# Patient Record
Sex: Female | Born: 1995 | Hispanic: Yes | Marital: Single | Smoking: Never smoker
Health system: Southern US, Community
[De-identification: ages and names within clinical notes are randomized; demographics above are authoritative.]

## PROBLEM LIST (undated history)

## (undated) HISTORY — PX: WISDOM TOOTH EXTRACTION: SHX21

---
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
## 2016-01-17 ENCOUNTER — Emergency Department (HOSPITAL_COMMUNITY): Payer: BLUE CROSS/BLUE SHIELD

## 2016-01-17 ENCOUNTER — Encounter (HOSPITAL_COMMUNITY): Payer: Self-pay

## 2016-01-17 ENCOUNTER — Emergency Department (HOSPITAL_COMMUNITY)
Admission: EM | Admit: 2016-01-17 | Discharge: 2016-01-17 | Disposition: A | Discharge status: Home / Self Care | Payer: BLUE CROSS/BLUE SHIELD | Attending: Emergency Medicine | Admitting: Emergency Medicine

## 2016-01-17 DIAGNOSIS — Y998 Other external cause status: Secondary | ICD-10-CM | POA: Diagnosis not present

## 2016-01-17 DIAGNOSIS — Y92410 Unspecified street and highway as the place of occurrence of the external cause: Secondary | ICD-10-CM | POA: Diagnosis not present

## 2016-01-17 DIAGNOSIS — S060X0A Concussion without loss of consciousness, initial encounter: Secondary | ICD-10-CM | POA: Insufficient documentation

## 2016-01-17 DIAGNOSIS — Y9389 Activity, other specified: Secondary | ICD-10-CM | POA: Insufficient documentation

## 2016-01-17 DIAGNOSIS — S0990XA Unspecified injury of head, initial encounter: Secondary | ICD-10-CM | POA: Diagnosis present

## 2016-01-17 MED ORDER — IBUPROFEN 600 MG PO TABS: 600.0000 mg | ORAL_TABLET | Freq: Four times a day (QID) | ORAL | Status: AC | PRN
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
## 2016-01-17 NOTE — Discharge Instructions (Signed)
Concussion, Adult  A concussion, or closed-head injury, is a brain injury caused by a direct blow to the head or by a quick and sudden movement (jolt) of the head or neck. Concussions are usually not life-threatening. Even so, the effects of a concussion can be serious. If you have had a concussion before, you are more likely to experience concussion-like symptoms after a direct blow to the head.   CAUSES  · Direct blow to the head, such as from running into another player during a soccer game, being hit in a fight, or hitting your head on a hard surface.  · A jolt of the head or neck that causes the brain to move back and forth inside the skull, such as in a car crash.  SIGNS AND SYMPTOMS  The signs of a concussion can be hard to notice. Early on, they may be missed by you, family members, and health care providers. You may look fine but act or feel differently.  Symptoms are usually temporary, but they may last for days, weeks, or even longer. Some symptoms may appear right away while others may not show up for hours or days. Every head injury is different. Symptoms include:  · Mild to moderate headaches that will not go away.  · A feeling of pressure inside your head.  · Having more trouble than usual:    Learning or remembering things you have heard.    Answering questions.    Paying attention or concentrating.    Organizing daily tasks.    Making decisions and solving problems.  · Slowness in thinking, acting or reacting, speaking, or reading.  · Getting lost or being easily confused.  · Feeling tired all the time or lacking energy (fatigued).  · Feeling drowsy.  · Sleep disturbances.    Sleeping more than usual.    Sleeping less than usual.    Trouble falling asleep.    Trouble sleeping (insomnia).  · Loss of balance or feeling lightheaded or dizzy.  · Nausea or vomiting.  · Numbness or tingling.  · Increased sensitivity to:    Sounds.    Lights.    Distractions.  · Vision problems or eyes that tire  easily.  · Diminished sense of taste or smell.  · Ringing in the ears.  · Mood changes such as feeling sad or anxious.  · Becoming easily irritated or angry for little or no reason.  · Lack of motivation.  · Seeing or hearing things other people do not see or hear (hallucinations).  DIAGNOSIS  Your health care provider can usually diagnose a concussion based on a description of your injury and symptoms. He or she will ask whether you passed out (lost consciousness) and whether you are having trouble remembering events that happened right before and during your injury.  Your evaluation might include:  · A brain scan to look for signs of injury to the brain. Even if the test shows no injury, you may still have a concussion.  · Blood tests to be sure other problems are not present.  TREATMENT  · Concussions are usually treated in an emergency department, in urgent care, or at a clinic. You may need to stay in the hospital overnight for further treatment.  · Tell your health care provider if you are taking any medicines, including prescription medicines, over-the-counter medicines, and natural remedies. Some medicines, such as blood thinners (anticoagulants) and aspirin, may increase the chance of complications. Also tell your health care   provider whether you have had alcohol or are taking illegal drugs. This information may affect treatment.  · Your health care provider will send you home with important instructions to follow.  · How fast you will recover from a concussion depends on many factors. These factors include how severe your concussion is, what part of your brain was injured, your age, and how healthy you were before the concussion.  · Most people with mild injuries recover fully. Recovery can take time. In general, recovery is slower in older persons. Also, persons who have had a concussion in the past or have other medical problems may find that it takes longer to recover from their current injury.  HOME  CARE INSTRUCTIONS  General Instructions  · Carefully follow the directions your health care provider gave you.  · Only take over-the-counter or prescription medicines for pain, discomfort, or fever as directed by your health care provider.  · Take only those medicines that your health care provider has approved.  · Do not drink alcohol until your health care provider says you are well enough to do so. Alcohol and certain other drugs may slow your recovery and can put you at risk of further injury.  · If it is harder than usual to remember things, write them down.  · If you are easily distracted, try to do one thing at a time. For example, do not try to watch TV while fixing dinner.  · Talk with family members or close friends when making important decisions.  · Keep all follow-up appointments. Repeated evaluation of your symptoms is recommended for your recovery.  · Watch your symptoms and tell others to do the same. Complications sometimes occur after a concussion. Older adults with a brain injury may have a higher risk of serious complications, such as a blood clot on the brain.  · Tell your teachers, school nurse, school counselor, coach, athletic trainer, or work manager about your injury, symptoms, and restrictions. Tell them about what you can or cannot do. They should watch for:    Increased problems with attention or concentration.    Increased difficulty remembering or learning new information.    Increased time needed to complete tasks or assignments.    Increased irritability or decreased ability to cope with stress.    Increased symptoms.  · Rest. Rest helps the brain to heal. Make sure you:    Get plenty of sleep at night. Avoid staying up late at night.    Keep the same bedtime hours on weekends and weekdays.    Rest during the day. Take daytime naps or rest breaks when you feel tired.  · Limit activities that require a lot of thought or concentration. These include:    Doing homework or job-related  work.    Watching TV.    Working on the computer.  · Avoid any situation where there is potential for another head injury (football, hockey, soccer, basketball, martial arts, downhill snow sports and horseback riding). Your condition will get worse every time you experience a concussion. You should avoid these activities until you are evaluated by the appropriate follow-up health care providers.  Returning To Your Regular Activities  You will need to return to your normal activities slowly, not all at once. You must give your body and brain enough time for recovery.  · Do not return to sports or other athletic activities until your health care provider tells you it is safe to do so.  · Ask   your health care provider when you can drive, ride a bicycle, or operate heavy machinery. Your ability to react may be slower after a brain injury. Never do these activities if you are dizzy.  · Ask your health care provider about when you can return to work or school.  Preventing Another Concussion  It is very important to avoid another brain injury, especially before you have recovered. In rare cases, another injury can lead to permanent brain damage, brain swelling, or death. The risk of this is greatest during the first 7-10 days after a head injury. Avoid injuries by:  · Wearing a seat belt when riding in a car.  · Drinking alcohol only in moderation.  · Wearing a helmet when biking, skiing, skateboarding, skating, or doing similar activities.  · Avoiding activities that could lead to a second concussion, such as contact or recreational sports, until your health care provider says it is okay.  · Taking safety measures in your home.    Remove clutter and tripping hazards from floors and stairways.    Use grab bars in bathrooms and handrails by stairs.    Place non-slip mats on floors and in bathtubs.    Improve lighting in dim areas.  SEEK MEDICAL CARE IF:  · You have increased problems paying attention or  concentrating.  · You have increased difficulty remembering or learning new information.  · You need more time to complete tasks or assignments than before.  · You have increased irritability or decreased ability to cope with stress.  · You have more symptoms than before.  Seek medical care if you have any of the following symptoms for more than 2 weeks after your injury:  · Lasting (chronic) headaches.  · Dizziness or balance problems.  · Nausea.  · Vision problems.  · Increased sensitivity to noise or light.  · Depression or mood swings.  · Anxiety or irritability.  · Memory problems.  · Difficulty concentrating or paying attention.  · Sleep problems.  · Feeling tired all the time.  SEEK IMMEDIATE MEDICAL CARE IF:  · You have severe or worsening headaches. These may be a sign of a blood clot in the brain.  · You have weakness (even if only in one hand, leg, or part of the face).  · You have numbness.  · You have decreased coordination.  · You vomit repeatedly.  · You have increased sleepiness.  · One pupil is larger than the other.  · You have convulsions.  · You have slurred speech.  · You have increased confusion. This may be a sign of a blood clot in the brain.  · You have increased restlessness, agitation, or irritability.  · You are unable to recognize people or places.  · You have neck pain.  · It is difficult to wake you up.  · You have unusual behavior changes.  · You lose consciousness.  MAKE SURE YOU:  · Understand these instructions.  · Will watch your condition.  · Will get help right away if you are not doing well or get worse.     This information is not intended to replace advice given to you by your health care provider. Make sure you discuss any questions you have with your health care provider.     Document Released: 01/25/2004 Document Revised: 11/25/2014 Document Reviewed: 05/27/2013  Elsevier Interactive Patient Education ©2016 [employer]
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
## 2019-07-15 ENCOUNTER — Other Ambulatory Visit: Payer: Self-pay

## 2019-07-15 DIAGNOSIS — Z20822 Contact with and (suspected) exposure to covid-19: Secondary | ICD-10-CM

## 2019-07-17 LAB — NOVEL CORONAVIRUS, NAA: SARS-CoV-2, NAA: NOT DETECTED

## 2019-10-07 ENCOUNTER — Encounter (HOSPITAL_COMMUNITY): Payer: Self-pay | Admitting: Emergency Medicine

## 2019-10-07 ENCOUNTER — Other Ambulatory Visit: Payer: Self-pay

## 2019-10-07 ENCOUNTER — Ambulatory Visit (HOSPITAL_COMMUNITY)
Admission: EM | Admit: 2019-10-07 | Discharge: 2019-10-07 | Disposition: A | Discharge status: Home / Self Care | Payer: 59 | Attending: Emergency Medicine | Admitting: Emergency Medicine

## 2019-10-07 DIAGNOSIS — L233 Allergic contact dermatitis due to drugs in contact with skin: Secondary | ICD-10-CM | POA: Diagnosis not present

## 2019-10-07 DIAGNOSIS — R21 Rash and other nonspecific skin eruption: Secondary | ICD-10-CM | POA: Diagnosis not present

## 2019-10-07 MED ORDER — HYDROXYZINE HCL 25 MG PO TABS: 25.0000 mg | ORAL_TABLET | Freq: Four times a day (QID) | ORAL | 0 refills | Status: DC

## 2019-10-07 MED ORDER — METHYLPREDNISOLONE SODIUM SUCC 125 MG IJ SOLR: 125.0000 mg | Freq: Once | INTRAMUSCULAR | Status: DC

## 2019-10-07 MED ORDER — PREDNISONE 10 MG (21) PO TBPK: ORAL_TABLET | Freq: Every day | ORAL | 0 refills | Status: DC

## 2019-10-07 MED ORDER — METHYLPREDNISOLONE SODIUM SUCC 125 MG IJ SOLR
INTRAMUSCULAR | Status: AC
  Filled 2019-10-07: qty 2

## 2019-10-07 NOTE — ED Provider Notes (Signed)
[facility]    CSN: [id number] Arrival date & time: 10/07/19  1703      History   Chief Complaint Chief Complaint  Patient presents with  . Rash    HPI Nicole Serrano is a 23 y.o. female.   Pt has been taking pcn for 1 week for wisdom teeth removal and hydrocodone. Noticed this rash to entire body today stopped the pcn yesterday. Itching redness. Non raised areas. Denies any sob, no cough, no swelling of throat. Has not taken anything pta      History reviewed. No pertinent past medical history.  There are no active problems to display for this patient.   Past Surgical History:  Procedure Laterality Date  . WISDOM TOOTH EXTRACTION      OB History    Gravida  0   Para  0   Term  0   Preterm  0   AB  0   Living  0     SAB  0   TAB  0   Ectopic  0   Multiple  0   Live Births               Home Medications    Prior to Admission medications   Medication Sig Start Date End Date Taking? Authorizing Provider  HYDROcodone-acetaminophen (NORCO/VICODIN) 5-325 MG tablet Take 1 tablet by mouth every 6 (six) hours as needed for moderate pain.   Yes [provider]  ibuprofen (ADVIL,MOTRIN) 600 MG tablet Take 1 tablet (600 mg total) by mouth every 6 (six) hours as needed. 01/17/16  Yes Idol, Raynelle Fanning, PA-C  hydrOXYzine (ATARAX/VISTARIL) 25 MG tablet Take 1 tablet (25 mg total) by mouth every 6 (six) hours. 10/07/19   Coralyn Mark, NP  predniSONE (STERAPRED UNI-PAK 21 TAB) 10 MG (21) TBPK tablet Take by mouth daily. Take 6 tabs by mouth daily  for 2 days, then 5 tabs for 2 days, then 4 tabs for 2 days, then 3 tabs for 2 days, 2 tabs for 2 days, then 1 tab by mouth daily for 2 days 10/07/19   Coralyn Mark, NP    Family History No family history on file.  Social History Social History   Tobacco Use  . Smoking status: Never Smoker  Substance Use Topics  . Alcohol use: No  . Drug use: No     Allergies   Patient has no  known allergies.   Review of Systems Review of Systems  Constitutional: Negative.   HENT: Negative.   Eyes: Negative.   Respiratory: Negative.   Cardiovascular: Negative.   Gastrointestinal: Negative.   Musculoskeletal: Negative.   Skin: Positive for rash.       To enitre body and flushed red cheeks   Neurological: Negative.      Physical Exam Triage Vital Signs ED Triage Vitals  Enc Vitals Group     BP 10/07/19 1730 114/80     Pulse Rate 10/07/19 1730 91     Resp 10/07/19 1730 16     Temp 10/07/19 1730 98.7 F (37.1 C)     Temp Source 10/07/19 1730 Oral     SpO2 10/07/19 1730 98 %     Weight --      Height --      Head Circumference --      Peak Flow --      Pain Score 10/07/19 1727 0     Pain Loc --      Pain  Edu? --      Excl. in [place]? --    No data found.  Updated Vital Signs BP 114/80   Pulse 91   Temp 98.7 F (37.1 C) (Oral)   Resp 16   LMP 09/30/2019   SpO2 98%   Visual Acuity     Physical Exam Neck:     Musculoskeletal: Normal range of motion.  Cardiovascular:     Rate and Rhythm: Normal rate.     Pulses: Normal pulses.  Pulmonary:     Effort: Pulmonary effort is normal.  Abdominal:     General: Abdomen is flat.  Musculoskeletal: Normal range of motion.  Skin:    Findings: Erythema and rash present.     Comments: Non raised erythema, rash to entire body surface , itching   Neurological:     Mental Status: She is alert.      UC Treatments / Results  Labs (all labs ordered are listed, but only abnormal results are displayed) Labs Reviewed - No data to display  EKG   Radiology No results found.  Procedures Procedures (including critical care time)  Medications Ordered in UC Medications  methylPREDNISolone sodium succinate (SOLU-MEDROL) 125 mg/2 mL injection 125 mg (has no administration in time range)    Initial Impression / Assessment and Plan / UC Course  I have reviewed the triage vital signs and the nursing notes.   Pertinent labs & imaging results that were available during my care of the patient were reviewed by me and considered in my medical decision making (see chart for details).     Do not drive while taking benadryl or hydroxyzine  If you begin to have shortness of breath go to the er  Stop taking th pain meds and pcn due to unknown of the case of the allergy  May see and allergy specialist to verify cause  Pt is driving not able to give benadryl  Final Clinical Impressions(s) / UC Diagnoses   Final diagnoses:  Rash  Allergic contact dermatitis due to drugs in contact with skin     Discharge Instructions     Do not drive while taking benadryl or hydroxyzine  If you begin to have shortness of breath go to the er  Stop taking th pain meds and pcn due to unknown of the case of the allergy  May see and allergy specialist to verify cause We do not have pt flu shots at this urgent care will need to see your pcp for this    ED Prescriptions    Medication Sig Dispense Auth. Provider   hydrOXYzine (ATARAX/VISTARIL) 25 MG tablet Take 1 tablet (25 mg total) by mouth every 6 (six) hours. 12 tablet Morley Kos L, NP   predniSONE (STERAPRED UNI-PAK 21 TAB) 10 MG (21) TBPK tablet Take by mouth daily. Take 6 tabs by mouth daily  for 2 days, then 5 tabs for 2 days, then 4 tabs for 2 days, then 3 tabs for 2 days, 2 tabs for 2 days, then 1 tab by mouth daily for 2 days 42 tablet Marney Setting, NP     PDMP not reviewed this encounter.   Marney Setting, NP 10/07/19 1806

## 2019-10-07 NOTE — Discharge Instructions (Addendum)
Do not drive while taking benadryl or hydroxyzine  If you begin to have shortness of breath go to the er  Stop taking th pain meds and pcn due to unknown of the case of the allergy  May see and allergy specialist to verify cause We do not have pt flu shots at this urgent care will need to see your pcp for this

## 2019-10-07 NOTE — ED Triage Notes (Signed)
Had wisdom teeth removed 9 days ago. Just finished 1 week course of amoxicillin. Woke up this morning with no symptoms, at 2 pm developed itchy rash on arms and inner thighs. No new products. Unsure if she has had a cillin before.

## 2019-10-21 DIAGNOSIS — F4321 Adjustment disorder with depressed mood: Secondary | ICD-10-CM | POA: Diagnosis not present

## 2019-10-21 DIAGNOSIS — E669 Obesity, unspecified: Secondary | ICD-10-CM | POA: Diagnosis not present

## 2019-10-21 DIAGNOSIS — E781 Pure hyperglyceridemia: Secondary | ICD-10-CM | POA: Diagnosis not present

## 2019-10-26 ENCOUNTER — Ambulatory Visit (HOSPITAL_COMMUNITY)
Admission: EM | Admit: 2019-10-26 | Discharge: 2019-10-26 | Disposition: A | Discharge status: Home / Self Care | Payer: HRSA Program | Attending: Family Medicine | Admitting: Family Medicine

## 2019-10-26 ENCOUNTER — Other Ambulatory Visit: Payer: Self-pay

## 2019-10-26 ENCOUNTER — Encounter (HOSPITAL_COMMUNITY): Payer: Self-pay

## 2019-10-26 DIAGNOSIS — Z20828 Contact with and (suspected) exposure to other viral communicable diseases: Secondary | ICD-10-CM | POA: Insufficient documentation

## 2019-10-26 DIAGNOSIS — Z20822 Contact with and (suspected) exposure to covid-19: Secondary | ICD-10-CM

## 2019-10-26 NOTE — ED Triage Notes (Signed)
Pt presents for covid testing after a few family members test positive for covid; pt does not have any symptoms.

## 2019-10-26 NOTE — ED Provider Notes (Signed)
[facility]    CSN: [id number] Arrival date & time: 10/26/19  1043      History   Chief Complaint Chief Complaint  Patient presents with  . Appointment  . (11 am Covid Exposure)    HPI Nicole Serrano is a 23 y.o. female.   HPI  Has no symptoms.  She lives in a household with her family.  Her father has been sick since last week.  She took him for Covid testing on Friday.  They found out yesterday for this test is positive.  She is here for Covid testing.  She is feeling well.  She is currently not employed.  History reviewed. No pertinent past medical history.  There are no active problems to display for this patient.   Past Surgical History:  Procedure Laterality Date  . WISDOM TOOTH EXTRACTION      OB History    Gravida  0   Para  0   Term  0   Preterm  0   AB  0   Living  0     SAB  0   TAB  0   Ectopic  0   Multiple  0   Live Births               Home Medications    Prior to Admission medications   Medication Sig Start Date End Date Taking? Authorizing Provider  ibuprofen (ADVIL,MOTRIN) 600 MG tablet Take 1 tablet (600 mg total) by mouth every 6 (six) hours as needed. 01/17/16   Evalee Jefferson, PA-C    Family History Family History  Family history unknown: Yes    Social History Social History   Tobacco Use  . Smoking status: Never Smoker  Substance Use Topics  . Alcohol use: No  . Drug use: No     Allergies   Patient has no known allergies.   Review of Systems Review of Systems  Constitutional: Negative for chills and fever.  HENT: Negative for ear pain and sore throat.   Eyes: Negative for pain and visual disturbance.  Respiratory: Negative for cough and shortness of breath.   Cardiovascular: Negative for chest pain and palpitations.  Gastrointestinal: Negative for abdominal pain and vomiting.  Genitourinary: Negative for dysuria and hematuria.  Musculoskeletal: Negative for arthralgias and back pain.  Skin:  Negative for color change and rash.  Neurological: Negative for seizures, syncope and headaches.  All other systems reviewed and are negative.    Physical Exam Triage Vital Signs ED Triage Vitals [10/26/19 1113]  Enc Vitals Group     BP 114/74     Pulse Rate 78     Resp 18     Temp 98.4 F (36.9 C)     Temp Source Oral     SpO2 99 %   No data found.  Updated Vital Signs BP 114/74 (BP Location: Left Arm)   Pulse 78   Temp 98.4 F (36.9 C) (Oral)   Resp 18   LMP 09/30/2019   SpO2 99%      Physical Exam Constitutional:      General: She is not in acute distress.    Appearance: She is well-developed and normal weight.  HENT:     Head: Normocephalic and atraumatic.  Eyes:     Conjunctiva/sclera: Conjunctivae normal.     Pupils: Pupils are equal, round, and reactive to light.  Neck:     Musculoskeletal: Normal range of motion.  Cardiovascular:  Rate and Rhythm: Normal rate.  Pulmonary:     Effort: Pulmonary effort is normal. No respiratory distress.  Abdominal:     General: There is no distension.     Palpations: Abdomen is soft.  Musculoskeletal: Normal range of motion.  Skin:    General: Skin is warm and dry.  Neurological:     Mental Status: She is alert.  Psychiatric:        Mood and Affect: Mood normal.        Behavior: Behavior normal.      UC Treatments / Results  Labs (all labs ordered are listed, but only abnormal results are displayed) Labs Reviewed  NOVEL CORONAVIRUS, NAA (HOSP ORDER, SEND-OUT TO REF LAB; TAT 18-24 HRS)    EKG   Radiology No results found.  Procedures Procedures (including critical care time)  Medications Ordered in UC Medications - No data to display  Initial Impression / Assessment and Plan / UC Course  I have reviewed the triage vital signs and the nursing notes.  Pertinent labs & imaging results that were available during my care of the patient were reviewed by me and considered in my medical decision  making (see chart for details).     Reviewed signs and symptoms of Covid.  Quarantine recommendations Final Clinical Impressions(s) / UC Diagnoses   Final diagnoses:  Close exposure to COVID-19 virus     Discharge Instructions     Home to rest Push fluids Tylenol for pain or fever Check for results on My Chart   ED Prescriptions    None     PDMP not reviewed this encounter.   Eustace Moore, MD 10/26/19 [phone]

## 2019-10-26 NOTE — Discharge Instructions (Signed)
Home to rest Push fluids Tylenol for pain or fever Check for results on My Chart

## 2019-10-28 LAB — NOVEL CORONAVIRUS, NAA (HOSP ORDER, SEND-OUT TO REF LAB; TAT 18-24 HRS): SARS-CoV-2, NAA: NOT DETECTED

## 2019-11-26 DIAGNOSIS — R21 Rash and other nonspecific skin eruption: Secondary | ICD-10-CM | POA: Diagnosis not present

## 2019-11-26 DIAGNOSIS — J3089 Other allergic rhinitis: Secondary | ICD-10-CM | POA: Diagnosis not present

## 2019-11-26 DIAGNOSIS — J3081 Allergic rhinitis due to animal (cat) (dog) hair and dander: Secondary | ICD-10-CM | POA: Diagnosis not present

## 2019-11-26 DIAGNOSIS — J301 Allergic rhinitis due to pollen: Secondary | ICD-10-CM | POA: Diagnosis not present

## 2019-12-24 DIAGNOSIS — Z113 Encounter for screening for infections with a predominantly sexual mode of transmission: Secondary | ICD-10-CM | POA: Diagnosis not present

## 2019-12-24 DIAGNOSIS — L659 Nonscarring hair loss, unspecified: Secondary | ICD-10-CM | POA: Diagnosis not present

## 2019-12-24 DIAGNOSIS — E559 Vitamin D deficiency, unspecified: Secondary | ICD-10-CM | POA: Diagnosis not present

## 2019-12-24 DIAGNOSIS — Z124 Encounter for screening for malignant neoplasm of cervix: Secondary | ICD-10-CM | POA: Diagnosis not present

## 2019-12-24 DIAGNOSIS — F4321 Adjustment disorder with depressed mood: Secondary | ICD-10-CM | POA: Diagnosis not present

## 2020-03-02 DIAGNOSIS — Z124 Encounter for screening for malignant neoplasm of cervix: Secondary | ICD-10-CM | POA: Diagnosis not present

## 2020-05-10 DIAGNOSIS — L738 Other specified follicular disorders: Secondary | ICD-10-CM | POA: Diagnosis not present

## 2020-05-10 DIAGNOSIS — Z1283 Encounter for screening for malignant neoplasm of skin: Secondary | ICD-10-CM | POA: Diagnosis not present

## 2020-05-10 DIAGNOSIS — L700 Acne vulgaris: Secondary | ICD-10-CM | POA: Diagnosis not present

## 2020-05-10 DIAGNOSIS — B353 Tinea pedis: Secondary | ICD-10-CM | POA: Diagnosis not present

## 2020-06-09 DIAGNOSIS — J3089 Other allergic rhinitis: Secondary | ICD-10-CM | POA: Diagnosis not present

## 2020-06-09 DIAGNOSIS — J301 Allergic rhinitis due to pollen: Secondary | ICD-10-CM | POA: Diagnosis not present

## 2020-06-09 DIAGNOSIS — J3081 Allergic rhinitis due to animal (cat) (dog) hair and dander: Secondary | ICD-10-CM | POA: Diagnosis not present

## 2020-10-10 DIAGNOSIS — R079 Chest pain, unspecified: Secondary | ICD-10-CM | POA: Diagnosis not present

## 2020-11-02 DIAGNOSIS — R5383 Other fatigue: Secondary | ICD-10-CM | POA: Diagnosis not present

## 2020-11-02 DIAGNOSIS — R079 Chest pain, unspecified: Secondary | ICD-10-CM | POA: Diagnosis not present

## 2020-11-02 DIAGNOSIS — E559 Vitamin D deficiency, unspecified: Secondary | ICD-10-CM | POA: Diagnosis not present

## 2020-11-02 DIAGNOSIS — E781 Pure hyperglyceridemia: Secondary | ICD-10-CM | POA: Diagnosis not present

## 2020-11-02 DIAGNOSIS — E669 Obesity, unspecified: Secondary | ICD-10-CM | POA: Diagnosis not present

## 2020-11-16 DIAGNOSIS — Z23 Encounter for immunization: Secondary | ICD-10-CM | POA: Diagnosis not present
